# Patient Record
Sex: Female | Born: 1960 | Race: White | Hispanic: No | Marital: Married | Smoking: Current every day smoker
Health system: Southern US, Community
[De-identification: ages and names within clinical notes are randomized; demographics above are authoritative.]

## PROBLEM LIST (undated history)

## (undated) DIAGNOSIS — M5430 Sciatica, unspecified side: Secondary | ICD-10-CM

## (undated) DIAGNOSIS — M1990 Unspecified osteoarthritis, unspecified site: Secondary | ICD-10-CM

## (undated) DIAGNOSIS — M259 Joint disorder, unspecified: Secondary | ICD-10-CM

## (undated) HISTORY — DX: Sciatica, unspecified side: M54.30

## (undated) HISTORY — DX: Unspecified osteoarthritis, unspecified site: M19.90

## (undated) HISTORY — DX: Joint disorder, unspecified: M25.9

---

## 1980-01-17 HISTORY — PX: KNEE ARTHROPLASTY: SHX992

## 1999-01-17 HISTORY — PX: BREAST BIOPSY: SHX20

## 1999-01-17 HISTORY — PX: BREAST EXCISIONAL BIOPSY: SUR124

## 2010-01-16 LAB — HM MAMMOGRAPHY: HM MAMMO: NORMAL

## 2011-08-17 LAB — HM PAP SMEAR: HM Pap smear: NORMAL

## 2011-08-17 LAB — HM DEXA SCAN

## 2014-03-12 ENCOUNTER — Ambulatory Visit: Payer: Self-pay | Admitting: Family Medicine

## 2014-07-16 ENCOUNTER — Encounter: Payer: Self-pay | Admitting: Behavioral Health

## 2014-07-16 ENCOUNTER — Telehealth: Payer: Self-pay | Admitting: Behavioral Health

## 2014-07-16 NOTE — Telephone Encounter (Signed)
Pre-Visit Call completed with patient and chart updated.   Pre-Visit Info documented in Specialty Comments under Snap[employer]ot.    

## 2014-07-17 ENCOUNTER — Encounter: Payer: Self-pay | Admitting: Family Medicine

## 2014-07-17 ENCOUNTER — Ambulatory Visit (INDEPENDENT_AMBULATORY_CARE_PROVIDER_SITE_OTHER): Payer: BLUE CROSS/BLUE SHIELD | Admitting: Family Medicine

## 2014-07-17 VITALS — BP 130/80 | HR 81 | Temp 98.1°F | Resp 16 | Ht 62.75 in | Wt 253.1 lb

## 2014-07-17 DIAGNOSIS — Z1231 Encounter for screening mammogram for malignant neoplasm of breast: Secondary | ICD-10-CM

## 2014-07-17 DIAGNOSIS — Z0000 Encounter for general adult medical examination without abnormal findings: Secondary | ICD-10-CM | POA: Diagnosis not present

## 2014-07-17 DIAGNOSIS — Z1211 Encounter for screening for malignant neoplasm of colon: Secondary | ICD-10-CM

## 2014-07-17 LAB — HEPATIC FUNCTION PANEL
ALBUMIN: 3.7 g/dL (ref 3.5–5.2)
ALK PHOS: 91 U/L (ref 39–117)
ALT: 12 U/L (ref 0–35)
AST: 14 U/L (ref 0–37)
BILIRUBIN DIRECT: 0 mg/dL (ref 0.0–0.3)
BILIRUBIN TOTAL: 0.2 mg/dL (ref 0.2–1.2)
Total Protein: 7.4 g/dL (ref 6.0–8.3)

## 2014-07-17 LAB — CBC WITH DIFFERENTIAL/PLATELET
BASOS ABS: 0 10*3/uL (ref 0.0–0.1)
BASOS PCT: 0.4 % (ref 0.0–3.0)
EOS PCT: 1.6 % (ref 0.0–5.0)
Eosinophils Absolute: 0.1 10*3/uL (ref 0.0–0.7)
HEMATOCRIT: 40.5 % (ref 36.0–46.0)
HEMOGLOBIN: 13.1 g/dL (ref 12.0–15.0)
LYMPHS ABS: 2.3 10*3/uL (ref 0.7–4.0)
Lymphocytes Relative: 31 % (ref 12.0–46.0)
MCHC: 32.5 g/dL (ref 30.0–36.0)
MCV: 86.1 fl (ref 78.0–100.0)
Monocytes Absolute: 0.5 10*3/uL (ref 0.1–1.0)
Monocytes Relative: 6.7 % (ref 3.0–12.0)
NEUTROS PCT: 60.3 % (ref 43.0–77.0)
Neutro Abs: 4.5 10*3/uL (ref 1.4–7.7)
Platelets: 390 10*3/uL (ref 150.0–400.0)
RBC: 4.7 Mil/uL (ref 3.87–5.11)
RDW: 14.4 % (ref 11.5–15.5)
WBC: 7.5 10*3/uL (ref 4.0–10.5)

## 2014-07-17 LAB — BASIC METABOLIC PANEL
BUN: 13 mg/dL (ref 6–23)
CHLORIDE: 105 meq/L (ref 96–112)
CO2: 26 mEq/L (ref 19–32)
CREATININE: 0.78 mg/dL (ref 0.40–1.20)
Calcium: 8.9 mg/dL (ref 8.4–10.5)
GFR: 81.66 mL/min (ref >60.00)
GLUCOSE: 104 mg/dL — AB (ref 70–99)
Potassium: 3.8 mEq/L (ref 3.5–5.1)
Sodium: 139 mEq/L (ref 135–145)

## 2014-07-17 LAB — LIPID PANEL
CHOL/HDL RATIO: 5
Cholesterol: 147 mg/dL (ref 0–200)
HDL: 32.2 mg/dL — ABNORMAL LOW (ref >39.00)
LDL Cholesterol: 79 mg/dL (ref 0–99)
NonHDL: 114.8
Triglycerides: 178 mg/dL — ABNORMAL HIGH (ref 0.0–149.0)
VLDL: 35.6 mg/dL (ref 0.0–40.0)

## 2014-07-17 LAB — TSH: TSH: 1.58 u[IU]/mL (ref 0.35–4.50)

## 2014-07-17 LAB — VITAMIN D 25 HYDROXY (VIT D DEFICIENCY, FRACTURES): VITD: 10.2 ng/mL — ABNORMAL LOW (ref 30.00–100.00)

## 2014-07-17 NOTE — Progress Notes (Signed)
   Subjective:    Patient ID: Stephanie SarkNancy Callejo, female    DOB: 02/22/1960, 54 y.o.   MRN: [id number]  HPI New to establish.  Previous MD- none recently  GYN- Goins, last seen 2013 [facility]([place]).  Health maintenance- mammo done Aug 2015 at [facility] in [place].  Pap done 2013.  Has not had colonoscopy.   Review of Systems Patient reports no vision/ hearing changes, adenopathy,fever, weight change,  persistant/recurrent hoarseness , swallowing issues, chest pain, palpitations, edema, persistant/recurrent cough, hemoptysis, dyspnea (rest/exertional/paroxysmal nocturnal), gastrointestinal bleeding (melena, rectal bleeding), abdominal pain, significant heartburn, bowel changes, GU symptoms (dysuria, hematuria, incontinence), Gyn symptoms (abnormal  bleeding, pain),  syncope, focal weakness, memory loss, numbness & tingling, skin/hair/nail changes, abnormal bruising or bleeding, anxiety, or depression.     Objective:   Physical Exam General Appearance:    Alert, cooperative, no distress, appears stated age, obese  Head:    Normocephalic, without obvious abnormality, atraumatic  Eyes:    PERRL, conjunctiva/corneas clear, EOM's intact, fundi    benign, both eyes  Ears:    Normal TM's and external ear canals, both ears  Nose:   Nares normal, septum midline, mucosa normal, no drainage    or sinus tenderness  Throat:   Lips, mucosa, and tongue normal; teeth and gums normal  Neck:   Supple, symmetrical, trachea midline, no adenopathy;    Thyroid: no enlargement/tenderness/nodules  Back:     Symmetric, no curvature, ROM normal, no CVA tenderness  Lungs:     Clear to auscultation bilaterally, respirations unlabored  Chest Wall:    No tenderness or deformity   Heart:    Regular rate and rhythm, S1 and S2 normal, no murmur, rub   or gallop  Breast Exam:    Deferred to GYN  Abdomen:     Soft, non-tender, bowel sounds active all four quadrants,    no masses, no organomegaly  Genitalia:    Deferred to GYN    Rectal:    Extremities:   Extremities normal, atraumatic, no cyanosis or edema  Pulses:   2+ and symmetric all extremities  Skin:   Skin color, texture, turgor normal, no rashes or lesions  Lymph nodes:   Cervical, supraclavicular, and axillary nodes normal  Neurologic:   CNII-XII intact, normal strength, sensation and reflexes    throughout          Assessment & Plan:

## 2014-07-17 NOTE — Progress Notes (Signed)
Pre visit review using our clinic review tool, if applicable. No additional management support is needed unless otherwise documented below in the visit note. 

## 2014-07-17 NOTE — Patient Instructions (Signed)
Schedule your pap at your convenience (15 minute appt) We'll notify you of your lab results and make any changes if needed We'll call you with your mammogram appt and GI consultation to discuss colonoscopy Try and make healthy food choices and get regular exercise as you are able Call with any questions or concerns Welcome!  We're glad to have you!

## 2014-07-17 NOTE — Assessment & Plan Note (Signed)
Pt's PE WNL w/ exception of obesity.  Due for mammo- order entered.  Due for pap- pt to schedule at her convenience.  Check labs.  Anticipatory guidance provided.

## 2014-07-21 ENCOUNTER — Other Ambulatory Visit: Payer: Self-pay | Admitting: General Practice

## 2014-07-21 MED ORDER — VITAMIN D (ERGOCALCIFEROL) 1.25 MG (50000 UNIT) PO CAPS: 50000.0000 [IU] | ORAL_CAPSULE | ORAL | Status: DC

## 2014-09-01 ENCOUNTER — Ambulatory Visit (HOSPITAL_BASED_OUTPATIENT_CLINIC_OR_DEPARTMENT_OTHER)
Admission: RE | Admit: 2014-09-01 | Discharge: 2014-09-01 | Disposition: A | Discharge status: Home / Self Care | Payer: BLUE CROSS/BLUE SHIELD | Source: Ambulatory Visit | Attending: Family Medicine | Admitting: Family Medicine

## 2014-09-01 DIAGNOSIS — Z1231 Encounter for screening mammogram for malignant neoplasm of breast: Secondary | ICD-10-CM | POA: Diagnosis not present

## 2014-09-09 ENCOUNTER — Other Ambulatory Visit (HOSPITAL_COMMUNITY)
Admission: RE | Admit: 2014-09-09 | Discharge: 2014-09-09 | Disposition: A | Discharge status: Home / Self Care | Payer: BLUE CROSS/BLUE SHIELD | Source: Ambulatory Visit | Attending: Family Medicine | Admitting: Family Medicine

## 2014-09-09 ENCOUNTER — Encounter: Payer: Self-pay | Admitting: Family Medicine

## 2014-09-09 ENCOUNTER — Ambulatory Visit (INDEPENDENT_AMBULATORY_CARE_PROVIDER_SITE_OTHER): Payer: BLUE CROSS/BLUE SHIELD | Admitting: Family Medicine

## 2014-09-09 VITALS — BP 128/82 | HR 91 | Temp 98.0°F | Resp 16 | Ht 63.0 in | Wt 255.0 lb

## 2014-09-09 DIAGNOSIS — Z01419 Encounter for gynecological examination (general) (routine) without abnormal findings: Secondary | ICD-10-CM | POA: Diagnosis not present

## 2014-09-09 DIAGNOSIS — Z1151 Encounter for screening for human papillomavirus (HPV): Secondary | ICD-10-CM | POA: Insufficient documentation

## 2014-09-09 DIAGNOSIS — Z124 Encounter for screening for malignant neoplasm of cervix: Secondary | ICD-10-CM

## 2014-09-09 NOTE — Progress Notes (Signed)
Pre visit review using our clinic review tool, if applicable. No additional management support is needed unless otherwise documented below in the visit note. 

## 2014-09-09 NOTE — Patient Instructions (Signed)
Follow up in 1 year or as needed We'll notify you of your pap results and make any changes if needed Continue to work on healthy diet and regular exercise Call with any questions or concerns Happy Labor Day!!!

## 2014-09-11 ENCOUNTER — Encounter: Payer: Self-pay | Admitting: General Practice

## 2014-09-11 LAB — CYTOLOGY - PAP

## 2014-09-14 NOTE — Assessment & Plan Note (Signed)
Pap collected.  Breast exam performed.  No abnormalities noted on either

## 2014-09-14 NOTE — Progress Notes (Signed)
   Subjective:    Patient ID: Stephanie Gallagher, female    DOB: April 25, 1960, 54 y.o.   MRN: [id number]  HPI Here today for pap and breast exam.  No concerns.   Review of Systems No vaginal d/c, bleeding, pain, skin changes, masses, excessive cramping No breast masses, pain, skin changes, nipple d/c    Objective:   Physical Exam  Constitutional: She appears well-developed and well-nourished. No distress.  HENT:  Head: Normocephalic and atraumatic.  Pulmonary/Chest: She exhibits no tenderness. Right breast exhibits no inverted nipple, no mass, no nipple discharge, no skin change and no tenderness. Left breast exhibits no inverted nipple, no mass, no nipple discharge, no skin change and no tenderness.  Genitourinary: There is no rash, tenderness, lesion or injury on the right labia. There is no rash, tenderness, lesion or injury on the left labia. No erythema, tenderness or bleeding in the vagina. No foreign body around the vagina. No signs of injury around the vagina. No vaginal discharge found.  Vitals reviewed.         Assessment & Plan:

## 2015-01-17 HISTORY — PX: ROOT CANAL: SHX2363

## 2015-03-17 ENCOUNTER — Telehealth: Payer: Self-pay | Admitting: Family Medicine

## 2015-03-17 NOTE — Telephone Encounter (Signed)
Chart updated to reflect 

## 2015-03-17 NOTE — Telephone Encounter (Signed)
Pt declined the flu shot.

## 2015-08-17 ENCOUNTER — Telehealth: Payer: Self-pay | Admitting: Family Medicine

## 2015-08-17 NOTE — Telephone Encounter (Signed)
Pt called in to schedule her CPE. Pt says that she would like to switch providers. Pt would like to switch to Dr. Abner Greenspan.     CB:[phone]

## 2015-08-17 NOTE — Telephone Encounter (Signed)
OK with me if [facility] with Dr Beverely Low

## 2015-08-18 NOTE — Telephone Encounter (Signed)
Ok with me 

## 2015-08-18 NOTE — Telephone Encounter (Signed)
Called pt to schedule appt. Pt says that she would now like to establish with Dr. Patsy Lager instead.

## 2015-08-18 NOTE — Telephone Encounter (Signed)
ok 

## 2015-09-22 ENCOUNTER — Encounter: Payer: Self-pay | Admitting: *Deleted

## 2015-09-22 ENCOUNTER — Telehealth: Payer: Self-pay | Admitting: *Deleted

## 2015-09-22 NOTE — Telephone Encounter (Signed)
Pre-Visit Call completed with patient and chart updated.   Pre-Visit Info documented in Specialty Comments under Snap[employer]ot.    

## 2015-09-23 ENCOUNTER — Ambulatory Visit (INDEPENDENT_AMBULATORY_CARE_PROVIDER_SITE_OTHER): Payer: BLUE CROSS/BLUE SHIELD | Admitting: Family Medicine

## 2015-09-23 ENCOUNTER — Encounter: Payer: Self-pay | Admitting: Family Medicine

## 2015-09-23 ENCOUNTER — Ambulatory Visit (HOSPITAL_BASED_OUTPATIENT_CLINIC_OR_DEPARTMENT_OTHER)
Admission: RE | Admit: 2015-09-23 | Discharge: 2015-09-23 | Disposition: A | Discharge status: Home / Self Care | Payer: BLUE CROSS/BLUE SHIELD | Source: Ambulatory Visit | Attending: Family Medicine | Admitting: Family Medicine

## 2015-09-23 VITALS — BP 138/82 | HR 97 | Temp 98.6°F | Ht 63.0 in | Wt 250.8 lb

## 2015-09-23 DIAGNOSIS — M25561 Pain in right knee: Secondary | ICD-10-CM

## 2015-09-23 DIAGNOSIS — S8011XA Contusion of right lower leg, initial encounter: Secondary | ICD-10-CM | POA: Diagnosis not present

## 2015-09-23 DIAGNOSIS — Z1211 Encounter for screening for malignant neoplasm of colon: Secondary | ICD-10-CM

## 2015-09-23 DIAGNOSIS — W19XXXA Unspecified fall, initial encounter: Secondary | ICD-10-CM | POA: Insufficient documentation

## 2015-09-23 NOTE — Progress Notes (Signed)
[facility] [address], [address] [place], [place] [address] [phone]  Date:  09/23/2015   Name:  Stephanie Gallagher   DOB:  1960-12-20   MRN:  [id number]  PCP:  Abbe Amsterdam, MD    Chief Complaint: Establish Care (Former pt of Dr. Beverely Low. c/o still having lump and swelling around right knee since fallin on Aug. 3rd. )   History of Present Illness:  Stephanie Gallagher is a 55 y.o. very pleasant female patient who presents with the following:  Here today to establish care and discuss a knee problem She is overall doing well- however she fell about 5 weeks ago. She fell on 8/3- she has bad ankles and started to roll her ankle- she tried to correct but fell onto her right knee and shin.  She fell onto pavement.  A small rock broke the skin. Her leg swelled quite a bit and she bruised terribly from knee to ankle per her report.   She was seen by her doctor in [place] and had an x-ray; nothing was broken.  Her knee/ shin is getting better but is still bothering her some. The knee still swells by the end of the day.  The knee does feel unstable, but no clicking or popping. 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   She has not yet had a colonoscopy but would like to do cologuard.  We did this paperwork today Declines a flu shot today She is s/p menopause She plans to see me for a CPE in the next month or so  Patient Active Problem List   Diagnosis Date Noted  . Pap smear for cervical cancer screening 09/09/2014  . Physical exam 07/17/2014    Past Medical History:  Diagnosis Date  . 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 Osteoarthritis   . Sciatica     Past Surgical History:  Procedure Laterality Date  . BREAST BIOPSY Right 2001  . CESAREAN SECTION  1993  . KNEE ARTHROPLASTY Right 1982  . ROOT CANAL  2017    Social History   Substance Use Topics  . Smoking status: Current Every Day Smoker    Packs/day: 0.25    Types: Cigarettes  . Smokeless tobacco: Not on file  . Alcohol use Yes    Family History  Problem Relation Age of Onset  . Cancer Mother     mother passed of Gall bladder cancer  . Stroke Father     history of mini strokes  . Heart attack Paternal Grandfather     Allergies  Allergen Reactions  . Codeine Other (See Comments)    Pt. reported that she has blackouts and her pain level increases tremendously.  Marland Kitchen Hydrocodone Anaphylaxis    Medication list has been reviewed and updated.  Current Outpatient Prescriptions on File Prior to Visit  Medication Sig Dispense Refill  . BIOTIN PO Take by mouth daily.    . Cholecalciferol (VITAMIN D3) 5000 units TABS Take 1 tablet by mouth daily.    Marland Kitchen ibuprofen (ADVIL,MOTRIN) 800 MG tablet Take 800 mg by mouth daily as needed.     No current facility-administered medications on file prior to visit.     Review of Systems:  As per HPI- otherwise negative. No SOB, never had a DVT or PE, she is a light  smoker No chest pain She is not on hormones    Physical Examination: Vitals:   09/23/15 1518 09/23/15 1523  BP: (!) 146/78 138/82  Pulse: 97   Temp: 98.6 F (37 C)    Vitals:   09/23/15 1518  Weight: 250 lb 12.8 oz (113.8 kg)  Height: 5\' 3"  (1.6 m)   Body mass index is 44.43 kg/m. Ideal Body Weight: Weight in (lb) to have BMI = 25: 140.8  GEN: WDWN, NAD, Non-toxic, A & O x 3, obese but otherwise looks well HEENT: Atraumatic, Normocephalic. Neck supple. No masses, No LAD. Ears and Nose: No external deformity. CV: RRR, No M/G/R. No JVD. No thrill. No extra heart sounds. PULM: CTA B, no wheezes, crackles, rhonchi. No retractions. No resp. distress. No accessory muscle use. EXTR: No c/c/e NEURO Normal gait.  PSYCH: Normally interactive. Conversant. Not depressed or anxious appearing.  Calm demeanor.  Right knee: small scar lateral to the  patella from her operation years ago Some crepitus of the knee, no effusion She is tender along the lateral mid to upper tibia.  No current bruise, redness, swelling or heat No calf pain or swelling. Normal ROM of the knee and the ligaments are stable to my exam    Assessment and Plan: Contusion of right lower leg, initial encounter - Plan: DG Tibia/Fibula Right  Screening for colon cancer  Right knee pain - Plan: DG Knee Complete 4 Views Right  Here today to establish care and re-check on a knee and shin injury. Seen for this originally at another office. She still has pain after 5 weeks so will repeat films to r/o occult fracture.  Assuming this is negative she likely needs time for bone bruising and blood in tissues to resolve.  However if knee pain persists consider referral to ortho vs MRI Await films and will follow-up with her cologuard order sent   Signed Abbe AmsterdamJessica Javayah Magaw, MD

## 2015-09-23 NOTE — Patient Instructions (Signed)
It was very nice to meet you today Please schedule your physical at your convenience Go to the ground floor for x-rays; then you can head home; I'll be in touch with your results  The [employer] company should contact you about your cologuard exam

## 2015-09-23 NOTE — Progress Notes (Signed)
Pre visit review using our clinic review tool, if applicable. No additional management support is needed unless otherwise documented below in the visit note. 

## 2015-09-24 ENCOUNTER — Encounter: Payer: Self-pay | Admitting: Family Medicine

## 2015-10-14 ENCOUNTER — Encounter: Payer: Self-pay | Admitting: Family Medicine

## 2015-10-14 ENCOUNTER — Ambulatory Visit (INDEPENDENT_AMBULATORY_CARE_PROVIDER_SITE_OTHER): Payer: BLUE CROSS/BLUE SHIELD | Admitting: Family Medicine

## 2015-10-14 VITALS — BP 125/70 | HR 85 | Temp 98.4°F | Ht 63.0 in | Wt 250.0 lb

## 2015-10-14 DIAGNOSIS — Z1329 Encounter for screening for other suspected endocrine disorder: Secondary | ICD-10-CM

## 2015-10-14 DIAGNOSIS — Z13220 Encounter for screening for lipoid disorders: Secondary | ICD-10-CM

## 2015-10-14 DIAGNOSIS — Z130 Encounter for screening for diseases of the blood and blood-forming organs and certain disorders involving the immune mechanism: Secondary | ICD-10-CM | POA: Diagnosis not present

## 2015-10-14 DIAGNOSIS — Z131 Encounter for screening for diabetes mellitus: Secondary | ICD-10-CM | POA: Diagnosis not present

## 2015-10-14 DIAGNOSIS — E559 Vitamin D deficiency, unspecified: Secondary | ICD-10-CM

## 2015-10-14 DIAGNOSIS — Z0000 Encounter for general adult medical examination without abnormal findings: Secondary | ICD-10-CM | POA: Diagnosis not present

## 2015-10-14 NOTE — Progress Notes (Signed)
Pre visit review using our clinic review tool, if applicable. No additional management support is needed unless otherwise documented below in the visit note. 

## 2015-10-14 NOTE — Patient Instructions (Signed)
It was good to see you today- your blood pressure was fine on recheck I will be in touch with your labs Please call you last PCP office and see if you can get a date of your last tetanus shot Try to get a colonoscopy soon and a mammo in the next year

## 2015-10-14 NOTE — Progress Notes (Addendum)
[facility] [address] [place], [place] [address] [phone]  Date:  10/14/2015   Name:  Stephanie Gallagher   DOB:  1960/10/25   MRN:  [id number]  PCP:  Abbe Amsterdam, MD    Chief Complaint: Follow-up (Pt here for f/u. )   History of Present Illness:  Stephanie Gallagher is a 55 y.o. very pleasant female patient who presents with the following:  Here today for a CPE Pap last year- normal and HPV negative -she has never had an abnormal pap She gets mammo most years- never had anything concerning Her younger sister was recently dx with ovarian cancer- she is doing well and had surgery.   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 Obesity, morbid (HCC) 09/23/2015  . Pap smear for cervical cancer screening 09/09/2014  . Physical exam 07/17/2014    Past Medical History:  Diagnosis Date  . 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 Osteoarthritis   . Sciatica     Past Surgical History:  Procedure Laterality Date  . BREAST BIOPSY Right 2001  . CESAREAN SECTION  1993  . KNEE ARTHROPLASTY Right 1982  . ROOT CANAL  2017    Social History  Substance Use Topics  . Smoking status: Current Every Day Smoker    Packs/day: 0.25    Types: Cigarettes  . Smokeless tobacco: Not on file  . Alcohol use Yes    Family History  Problem Relation Age of Onset  . Cancer Mother     mother passed of Gall bladder cancer  . Stroke Father     history of mini strokes  . Heart attack Paternal Grandfather    . Ovarian cancer Other     half-sister. Stage 2    Allergies  Allergen Reactions  . Codeine Other (See Comments)    Pt. reported that she has blackouts and her pain level increases tremendously.  Marland Kitchen Hydrocodone Anaphylaxis    Medication list has been reviewed and updated.  Current Outpatient Prescriptions on File Prior to Visit  Medication Sig Dispense Refill  . BIOTIN PO Take by mouth daily.    . Cholecalciferol (VITAMIN D3) 5000 units TABS Take 1 tablet by mouth daily.    Marland Kitchen ibuprofen (ADVIL,MOTRIN) 800 MG tablet Take 800 mg by mouth daily as needed.     No current facility-administered medications on file prior to visit.     Review of Systems:  As per HPI- otherwise negative. No fever, chills, CP, SOB, nausea or vomiting   Physical Examination: Vitals:   10/14/15 1803 10/14/15 1806  BP: (!) 146/88 (!) 144/88  Pulse: 85   Temp: 98.4 F (36.9 C)    Vitals:   10/14/15 1803  Weight: 250 lb (113.4 kg)  Height: 5\' 3"  (1.6 m)   Body mass index is 44.29 kg/m. Ideal Body Weight: Weight in (lb) to have BMI = 25: 140.8  GEN: WDWN, NAD, Non-toxic, A & O x 3, obese, looks well HEENT: Atraumatic, Normocephalic. Neck supple. No masses, No LAD. Bilateral TM wnl, oropharynx normal.  PEERL,EOMI.   Ears and Nose: No external deformity. CV: RRR, No M/G/R. No JVD. No thrill. No extra heart sounds. PULM: CTA B, no wheezes, crackles, rhonchi. No retractions. No resp. distress. No accessory muscle use. ABD: S, NT, ND, +BS. No rebound. No HSM. EXTR: No c/c/e NEURO Normal gait.  PSYCH: Normally interactive. Conversant. Not depressed or anxious appearing.  Calm demeanor.    Assessment and Plan: Physical exam  Screening for hyperlipidemia - Plan: Lipid panel  Screening for deficiency anemia - Plan: CBC  Screening for diabetes mellitus - Plan: Comprehensive metabolic panel  Vitamin D deficiency - Plan: Vitamin D (25 hydroxy)  Screening for thyroid disorder - Plan: TSH  CPE  and labs today  It was good to see you today- your blood pressure was fine on recheck I will be in touch with your labs Please call you last PCP office and see if you can get a date of your last tetanus shot Try to get a colonoscopy soon and a mammo in the next year  Signed Abbe AmsterdamJessica Chaney Maclaren, MD

## 2015-10-15 LAB — LIPID PANEL
Cholesterol: 157 mg/dL (ref 0–200)
HDL: 33 mg/dL — AB (ref >39.00)
NonHDL: 124.17
TRIGLYCERIDES: 256 mg/dL — AB (ref 0.0–149.0)
Total CHOL/HDL Ratio: 5
VLDL: 51.2 mg/dL — AB (ref 0.0–40.0)

## 2015-10-15 LAB — COMPREHENSIVE METABOLIC PANEL
ALK PHOS: 103 U/L (ref 39–117)
ALT: 11 U/L (ref 0–35)
AST: 14 U/L (ref 0–37)
Albumin: 3.8 g/dL (ref 3.5–5.2)
BILIRUBIN TOTAL: 0.2 mg/dL (ref 0.2–1.2)
BUN: 12 mg/dL (ref 6–23)
CALCIUM: 8.8 mg/dL (ref 8.4–10.5)
CO2: 29 meq/L (ref 19–32)
Chloride: 103 mEq/L (ref 96–112)
Creatinine, Ser: 0.83 mg/dL (ref 0.40–1.20)
GFR: 75.66 mL/min (ref >60.00)
GLUCOSE: 89 mg/dL (ref 70–99)
POTASSIUM: 4.3 meq/L (ref 3.5–5.1)
Sodium: 139 mEq/L (ref 135–145)
TOTAL PROTEIN: 7.5 g/dL (ref 6.0–8.3)

## 2015-10-15 LAB — TSH: TSH: 1.91 u[IU]/mL (ref 0.35–4.50)

## 2015-10-15 LAB — CBC
HEMATOCRIT: 39.5 % (ref 36.0–46.0)
Hemoglobin: 12.8 g/dL (ref 12.0–15.0)
MCHC: 32.5 g/dL (ref 30.0–36.0)
MCV: 80.8 fl (ref 78.0–100.0)
Platelets: 329 10*3/uL (ref 150.0–400.0)
RBC: 4.89 Mil/uL (ref 3.87–5.11)
RDW: 15.5 % (ref 11.5–15.5)
WBC: 10.6 10*3/uL — AB (ref 4.0–10.5)

## 2015-10-15 LAB — VITAMIN D 25 HYDROXY (VIT D DEFICIENCY, FRACTURES): VITD: 43.84 ng/mL (ref 30.00–100.00)

## 2015-10-15 LAB — LDL CHOLESTEROL, DIRECT: Direct LDL: 87 mg/dL

## 2015-12-15 ENCOUNTER — Telehealth: Payer: Self-pay | Admitting: Family Medicine

## 2015-12-15 NOTE — Telephone Encounter (Signed)
Called pt- she had a mamm[facility] here at the [facility] last year- she then had a screening per her job with [employer] recently.  She was told that her results might be abnl, but they "could not tell me what was wrong."  She is not sure if there is a problem or if she just needs comparison. She is having the images send to me; I will then ask if radiology can compare the two.  Asked her to let me know if she does not hear from me about this in the next week and she agrees

## 2015-12-15 NOTE — Telephone Encounter (Addendum)
Relation to ZO:XWRUpt:self Call back number:[phone]   Reason for call:  Patient states " [facility]ena[facility]" came to patient work place 11/25/15 and when patient tried to obtain the report [facility] informed her additonal testing was required and stated no additional information can be given unless patient schedules appointment with [facility] patient states locations offered are [address]. Patient requested report please send to PCP [facility] informed report will be mailed. Patient wanted to inform PCP  [facility] [phone]

## 2015-12-23 ENCOUNTER — Encounter: Payer: Self-pay | Admitting: Family Medicine

## 2015-12-25 ENCOUNTER — Encounter: Payer: Self-pay | Admitting: Family Medicine

## 2016-01-12 ENCOUNTER — Encounter: Payer: Self-pay | Admitting: Family Medicine

## 2016-01-24 ENCOUNTER — Encounter: Payer: Self-pay | Admitting: Family Medicine

## 2016-09-27 ENCOUNTER — Telehealth: Payer: Self-pay | Admitting: *Deleted

## 2016-09-27 NOTE — Telephone Encounter (Signed)
Received Cologuard Order Cancellation: [id number]; order has been changed to Suspended for Inactivity, the order will be reactivated if patient returns their sample w/i 365 days of the Initial order. [employer] has made several attempts to reach patient by phone and letter unsuccessfully; forwarded to provider/SLS 09/12

## 2016-11-11 NOTE — Progress Notes (Addendum)
[facility] [place] [address], [address] [place], [place] [address] [phone] [phone]  Date:  11/13/2016   Name:  Stephanie Sarkancy Pfarr   DOB:  07/25/1960   MRN:  [id number]  PCP:  Pearline Cablesopland, Jessica C, MD    Chief Complaint: Annual Exam   History of Present Illness:  Stephanie Gallagher is a 56 y.o. very pleasant female patient who presents with the following:  Here today for a CPE Last seen by myself 9/17  Pap last year- normal and HPV negative -she has never had an abnormal pap She gets mammo most years- never had anything concerning Her younger sister was recently dx with ovarian cancer- she is doing well and had surgery.   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   Hopes to get a new job there  She is not sure about her last tetnus- will do for her today  She is not fasting- she ate a chick fil a sandwich for lunch  Patient Active Problem List   Diagnosis Date Noted  . Obesity, morbid (HCC) 09/23/2015  . Pap smear for cervical cancer screening 09/09/2014  . Physical exam 07/17/2014    Past Medical History:  Diagnosis Date  . 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 Osteoarthritis   . Sciatica     Past Surgical History:  Procedure Laterality Date  . BREAST BIOPSY Right 2001  . CESAREAN SECTION  1993  . KNEE ARTHROPLASTY Right 1982  . ROOT CANAL  2017    Social History  Substance Use Topics  . Smoking status: Current Every Day Smoker    Packs/day: 0.25    Types: Cigarettes  . Smokeless tobacco: Never Used  . Alcohol use Yes    Family History  Problem Relation Age of Onset  . Cancer Mother        mother passed of Gall bladder cancer  . Stroke Father        history of mini strokes  . Heart attack Paternal Grandfather   . Ovarian cancer Other        half-sister. Stage 2    Allergies  Allergen Reactions  . Codeine Other (See Comments)    Pt. reported that she has blackouts and her pain level increases tremendously.  Marland Kitchen. Hydrocodone Anaphylaxis    Medication list has been reviewed and updated.  Current Outpatient Prescriptions on File Prior to Visit  Medication Sig Dispense Refill  . ibuprofen (ADVIL,MOTRIN) 800 MG tablet  Take 800 mg by mouth daily as needed.     No current facility-administered medications on file prior to visit.     Review of Systems:  As per HPI- otherwise negative. No fever or chills No CP or SOB No concern about breast masses  No nausea, vomiting or diarrhea Menopausal   Physical Examination: Vitals:   11/13/16 1506  BP: 140/78  Temp: 98.1 F (36.7 C)   Vitals:   11/13/16 1506  Weight: 252 lb (114.3 kg)  Height: 5\' 2"  (1.575 m)   Body mass index is 46.09 kg/m. Ideal Body Weight: Weight in (lb) to have BMI = 25: 136.4  GEN: WDWN, NAD, Non-toxic, A & O x 3, obese, otherwise looks well HEENT: Atraumatic, Normocephalic. Neck supple. No masses, No LAD.  Bilateral TM wnl, oropharynx normal.  PEERL,EOMI.   Ears and Nose: No external deformity. CV: RRR, No M/G/R. No JVD. No thrill. No extra heart sounds. PULM: CTA B, no wheezes, crackles,  rhonchi. No retractions. No resp. distress. No accessory muscle use. ABD: S, NT, ND. No rebound. No HSM. EXTR: No c/c/e NEURO Normal gait.  PSYCH: Normally interactive. Conversant. Not depressed or anxious appearing.  Calm demeanor.    Assessment and Plan: Physical exam  Screening for hyperlipidemia - Plan: Lipid panel  Screening for deficiency anemia - Plan: CBC  Screening for diabetes mellitus - Plan: Comprehensive metabolic panel, Hemoglobin A1c  Vitamin D deficiency - Plan: Vitamin D, 25-hydroxy  Immunization due - Plan: Tdap vaccine greater than or equal to 7yo IM  Screening for breast cancer - Plan: MM SCREENING BREAST TOMO BILATERAL  Estrogen deficiency - Plan: DG Bone Density  Encounter for hepatitis C screening test for low risk patient - Plan: Hepatitis C antibody  Screening for colon cancer  Here today for a CPE Referral for mammo and dexa Ordered cologuard Labs pending as above   Signed Abbe Amsterdam, MD Updated mammo order to a diagnostic per radiology Received her labs-  Results for orders placed or performed in visit on 11/13/16  CBC  Result Value Ref Range   WBC 9.0 4.0 - 10.5 K/uL   RBC 4.73 3.87 - 5.11 Mil/uL   Platelets 399.0 150.0 - 400.0 K/uL   Hemoglobin 12.7 12.0 - 15.0 g/dL   HCT 06.2 37.6 - 28.3 %   MCV 83.9 78.0 - 100.0 fl   MCHC 32.0 30.0 - 36.0 g/dL   RDW 15.1 (H) 76.1 - 60.7 %  Comprehensive metabolic panel  Result Value Ref Range   Sodium 140 135 - 145 mEq/L   Potassium 4.2 3.5 - 5.1 mEq/L   Chloride 104 96 - 112 mEq/L   CO2 28 19 - 32 mEq/L   Glucose, Bld 110 (H) 70 - 99 mg/dL   BUN 13 6 - 23 mg/dL   Creatinine, Ser 3.71 0.40 - 1.20 mg/dL   Total Bilirubin 0.2 0.2 - 1.2 mg/dL   Alkaline Phosphatase 96 39 - 117 U/L   AST 12 0 - 37 U/L   ALT 10 0 - 35 U/L   Total Protein 7.5 6.0 - 8.3 g/dL   Albumin 4.1 3.5 - 5.2 g/dL   Calcium 9.3 8.4 - 06.2 mg/dL   GFR 69.48 >54.62 mL/min  Hemoglobin A1c  Result Value Ref Range   Hgb  A1c MFr Bld 6.1 4.6 - 6.5 %  Lipid panel  Result Value Ref Range   Cholesterol 175 0 - 200 mg/dL   Triglycerides 703.5 (H) 0.0 - 149.0  mg/dL   HDL 16.10 >96.04 mg/dL   VLDL 54.0 0.0 - 98.1 mg/dL   LDL Cholesterol 191 (H) 0 - 99 mg/dL   Total CHOL/HDL Ratio 4    NonHDL 136.14   Vitamin D, 25-hydroxy  Result Value Ref Range   VITD 48.49 30.00 - 100.00 ng/mL  Hepatitis C antibody  Result Value Ref Range   Hepatitis C Ab NON-REACTIVE NON-REACTI   SIGNAL TO CUT-OFF 0.03 <1.00

## 2016-11-13 ENCOUNTER — Encounter: Payer: Self-pay | Admitting: Family Medicine

## 2016-11-13 ENCOUNTER — Ambulatory Visit (INDEPENDENT_AMBULATORY_CARE_PROVIDER_SITE_OTHER): Payer: Commercial Managed Care - PPO | Admitting: Family Medicine

## 2016-11-13 VITALS — BP 120/78 | HR 86 | Temp 98.1°F | Ht 62.0 in | Wt 252.0 lb

## 2016-11-13 DIAGNOSIS — Z1231 Encounter for screening mammogram for malignant neoplasm of breast: Secondary | ICD-10-CM

## 2016-11-13 DIAGNOSIS — Z1159 Encounter for screening for other viral diseases: Secondary | ICD-10-CM | POA: Diagnosis not present

## 2016-11-13 DIAGNOSIS — E2839 Other primary ovarian failure: Secondary | ICD-10-CM

## 2016-11-13 DIAGNOSIS — Z1239 Encounter for other screening for malignant neoplasm of breast: Secondary | ICD-10-CM

## 2016-11-13 DIAGNOSIS — Z131 Encounter for screening for diabetes mellitus: Secondary | ICD-10-CM | POA: Diagnosis not present

## 2016-11-13 DIAGNOSIS — Z130 Encounter for screening for diseases of the blood and blood-forming organs and certain disorders involving the immune mechanism: Secondary | ICD-10-CM

## 2016-11-13 DIAGNOSIS — Z13220 Encounter for screening for lipoid disorders: Secondary | ICD-10-CM

## 2016-11-13 DIAGNOSIS — R7303 Prediabetes: Secondary | ICD-10-CM

## 2016-11-13 DIAGNOSIS — Z23 Encounter for immunization: Secondary | ICD-10-CM

## 2016-11-13 DIAGNOSIS — E559 Vitamin D deficiency, unspecified: Secondary | ICD-10-CM

## 2016-11-13 DIAGNOSIS — Z0000 Encounter for general adult medical examination without abnormal findings: Secondary | ICD-10-CM

## 2016-11-13 DIAGNOSIS — Z1211 Encounter for screening for malignant neoplasm of colon: Secondary | ICD-10-CM | POA: Diagnosis not present

## 2016-11-13 NOTE — Patient Instructions (Addendum)
It was good to see you again today!  Take care and I will be in touch with your labs We will set you up for your bone density and mammogram tests, and will order cologuard for you  Health Maintenance for Postmenopausal Women Menopause is a normal process in which your reproductive ability comes to an end. This process happens gradually over a span of months to years, usually between the ages of 33 and 29. Menopause is complete when you have missed 12 consecutive menstrual periods. It is important to talk with your health care provider about some of the most common conditions that affect postmenopausal women, such as heart disease, cancer, and bone loss (osteoporosis). Adopting a healthy lifestyle and getting preventive care can help to promote your health and wellness. Those actions can also lower your chances of developing some of these common conditions. What should I know about menopause? During menopause, you may experience a number of symptoms, such as:  Moderate-to-severe hot flashes.  Night sweats.  Decrease in sex drive.  Mood swings.  Headaches.  Tiredness.  Irritability.  Memory problems.  Insomnia.  Choosing to treat or not to treat menopausal changes is an individual decision that you make with your health care provider. What should I know about hormone replacement therapy and supplements? Hormone therapy products are effective for treating symptoms that are associated with menopause, such as hot flashes and night sweats. Hormone replacement carries certain risks, especially as you become older. If you are thinking about using estrogen or estrogen with progestin treatments, discuss the benefits and risks with your health care provider. What should I know about heart disease and stroke? Heart disease, heart attack, and stroke become more likely as you age. This may be due, in part, to the hormonal changes that your body experiences during menopause. These can affect how your  body processes dietary fats, triglycerides, and cholesterol. Heart attack and stroke are both medical emergencies. There are many things that you can do to help prevent heart disease and stroke:  Have your blood pressure checked at least every 1-2 years. High blood pressure causes heart disease and increases the risk of stroke.  If you are 25-40 years old, ask your health care provider if you should take aspirin to prevent a heart attack or a stroke.  Do not use any tobacco products, including cigarettes, chewing tobacco, or electronic cigarettes. If you need help quitting, ask your health care provider.  It is important to eat a healthy diet and maintain a healthy weight. ? Be sure to include plenty of vegetables, fruits, low-fat dairy products, and lean protein. ? Avoid eating foods that are high in solid fats, added sugars, or salt (sodium).  Get regular exercise. This is one of the most important things that you can do for your health. ? Try to exercise for at least 150 minutes each week. The type of exercise that you do should increase your heart rate and make you sweat. This is known as moderate-intensity exercise. ? Try to do strengthening exercises at least twice each week. Do these in addition to the moderate-intensity exercise.  Know your numbers.Ask your health care provider to check your cholesterol and your blood glucose. Continue to have your blood tested as directed by your health care provider.  What should I know about cancer screening? There are several types of cancer. Take the following steps to reduce your risk and to catch any cancer development as early as possible. Breast Cancer  Practice  breast self-awareness. ? This means understanding how your breasts normally appear and feel. ? It also means doing regular breast self-exams. Let your health care provider know about any changes, no matter how small.  If you are 34 or older, have a clinician do a breast exam  (clinical breast exam or CBE) every year. Depending on your age, family history, and medical history, it may be recommended that you also have a yearly breast X-ray (mammogram).  If you have a family history of breast cancer, talk with your health care provider about genetic screening.  If you are at high risk for breast cancer, talk with your health care provider about having an MRI and a mammogram every year.  Breast cancer (BRCA) gene test is recommended for women who have family members with BRCA-related cancers. Results of the assessment will determine the need for genetic counseling and BRCA1 and for BRCA2 testing. BRCA-related cancers include these types: ? Breast. This occurs in males or females. ? Ovarian. ? Tubal. This may also be called fallopian tube cancer. ? Cancer of the abdominal or pelvic lining (peritoneal cancer). ? Prostate. ? Pancreatic.  Cervical, Uterine, and Ovarian Cancer Your health care provider may recommend that you be screened regularly for cancer of the pelvic organs. These include your ovaries, uterus, and vagina. This screening involves a pelvic exam, which includes checking for microscopic changes to the surface of your cervix (Pap test).  For women ages 21-65, health care providers may recommend a pelvic exam and a Pap test every three years. For women ages 59-65, they may recommend the Pap test and pelvic exam, combined with testing for human papilloma virus (HPV), every five years. Some types of HPV increase your risk of cervical cancer. Testing for HPV may also be done on women of any age who have unclear Pap test results.  Other health care providers may not recommend any screening for nonpregnant women who are considered low risk for pelvic cancer and have no symptoms. Ask your health care provider if a screening pelvic exam is right for you.  If you have had past treatment for cervical cancer or a condition that could lead to cancer, you need Pap tests  and screening for cancer for at least 20 years after your treatment. If Pap tests have been discontinued for you, your risk factors (such as having a new sexual partner) need to be reassessed to determine if you should start having screenings again. Some women have medical problems that increase the chance of getting cervical cancer. In these cases, your health care provider may recommend that you have screening and Pap tests more often.  If you have a family history of uterine cancer or ovarian cancer, talk with your health care provider about genetic screening.  If you have vaginal bleeding after reaching menopause, tell your health care provider.  There are currently no reliable tests available to screen for ovarian cancer.  Lung Cancer Lung cancer screening is recommended for adults 97-48 years old who are at high risk for lung cancer because of a history of smoking. A yearly low-dose CT scan of the lungs is recommended if you:  Currently smoke.  Have a history of at least 30 pack-years of smoking and you currently smoke or have quit within the past 15 years. A pack-year is smoking an average of one pack of cigarettes per day for one year.  Yearly screening should:  Continue until it has been 15 years since you quit.  Stop  if you develop a health problem that would prevent you from having lung cancer treatment.  Colorectal Cancer  This type of cancer can be detected and can often be prevented.  Routine colorectal cancer screening usually begins at age 24 and continues through age 20.  If you have risk factors for colon cancer, your health care provider may recommend that you be screened at an earlier age.  If you have a family history of colorectal cancer, talk with your health care provider about genetic screening.  Your health care provider may also recommend using home test kits to check for hidden blood in your stool.  A small camera at the end of a tube can be used to  examine your colon directly (sigmoidoscopy or colonoscopy). This is done to check for the earliest forms of colorectal cancer.  Direct examination of the colon should be repeated every 5-10 years until age 55. However, if early forms of precancerous polyps or small growths are found or if you have a family history or genetic risk for colorectal cancer, you may need to be screened more often.  Skin Cancer  Check your skin from head to toe regularly.  Monitor any moles. Be sure to tell your health care provider: ? About any new moles or changes in moles, especially if there is a change in a mole's shape or color. ? If you have a mole that is larger than the size of a pencil eraser.  If any of your family members has a history of skin cancer, especially at a young age, talk with your health care provider about genetic screening.  Always use sunscreen. Apply sunscreen liberally and repeatedly throughout the day.  Whenever you are outside, protect yourself by wearing long sleeves, pants, a wide-brimmed hat, and sunglasses.  What should I know about osteoporosis? Osteoporosis is a condition in which bone destruction happens more quickly than new bone creation. After menopause, you may be at an increased risk for osteoporosis. To help prevent osteoporosis or the bone fractures that can happen because of osteoporosis, the following is recommended:  If you are 23-52 years old, get at least 1,000 mg of calcium and at least 600 mg of vitamin D per day.  If you are older than age 64 but younger than age 68, get at least 1,200 mg of calcium and at least 600 mg of vitamin D per day.  If you are older than age 61, get at least 1,200 mg of calcium and at least 800 mg of vitamin D per day.  Smoking and excessive alcohol intake increase the risk of osteoporosis. Eat foods that are rich in calcium and vitamin D, and do weight-bearing exercises several times each week as directed by your health care  provider. What should I know about how menopause affects my mental health? Depression may occur at any age, but it is more common as you become older. Common symptoms of depression include:  Low or sad mood.  Changes in sleep patterns.  Changes in appetite or eating patterns.  Feeling an overall lack of motivation or enjoyment of activities that you previously enjoyed.  Frequent crying spells.  Talk with your health care provider if you think that you are experiencing depression. What should I know about immunizations? It is important that you get and maintain your immunizations. These include:  Tetanus, diphtheria, and pertussis (Tdap) booster vaccine.  Influenza every year before the flu season begins.  Pneumonia vaccine.  Shingles vaccine.  Your health  care provider may also recommend other immunizations. This information is not intended to replace advice given to you by your health care provider. Make sure you discuss any questions you have with your health care provider. Document Released: 02/24/2005 Document Revised: 07/23/2015 Document Reviewed: 10/06/2014 Elsevier Interactive Patient Education  2018 [employer].

## 2016-11-14 ENCOUNTER — Encounter: Payer: Self-pay | Admitting: Family Medicine

## 2016-11-14 DIAGNOSIS — R7303 Prediabetes: Secondary | ICD-10-CM | POA: Insufficient documentation

## 2016-11-14 LAB — LIPID PANEL
CHOL/HDL RATIO: 4
Cholesterol: 175 mg/dL (ref 0–200)
HDL: 39.2 mg/dL (ref >39.00)
LDL CALC: 100 mg/dL — AB (ref 0–99)
NonHDL: 136.14
TRIGLYCERIDES: 179 mg/dL — AB (ref 0.0–149.0)
VLDL: 35.8 mg/dL (ref 0.0–40.0)

## 2016-11-14 LAB — CBC
HCT: 39.7 % (ref 36.0–46.0)
Hemoglobin: 12.7 g/dL (ref 12.0–15.0)
MCHC: 32 g/dL (ref 30.0–36.0)
MCV: 83.9 fl (ref 78.0–100.0)
PLATELETS: 399 10*3/uL (ref 150.0–400.0)
RBC: 4.73 Mil/uL (ref 3.87–5.11)
RDW: 16.1 % — AB (ref 11.5–15.5)
WBC: 9 10*3/uL (ref 4.0–10.5)

## 2016-11-14 LAB — HEPATITIS C ANTIBODY
HEP C AB: NONREACTIVE
SIGNAL TO CUT-OFF: 0.03 (ref <1.00)

## 2016-11-14 LAB — COMPREHENSIVE METABOLIC PANEL
ALT: 10 U/L (ref 0–35)
AST: 12 U/L (ref 0–37)
Albumin: 4.1 g/dL (ref 3.5–5.2)
Alkaline Phosphatase: 96 U/L (ref 39–117)
BILIRUBIN TOTAL: 0.2 mg/dL (ref 0.2–1.2)
BUN: 13 mg/dL (ref 6–23)
CHLORIDE: 104 meq/L (ref 96–112)
CO2: 28 meq/L (ref 19–32)
CREATININE: 0.82 mg/dL (ref 0.40–1.20)
Calcium: 9.3 mg/dL (ref 8.4–10.5)
GFR: 76.42 mL/min (ref >60.00)
GLUCOSE: 110 mg/dL — AB (ref 70–99)
Potassium: 4.2 mEq/L (ref 3.5–5.1)
Sodium: 140 mEq/L (ref 135–145)
Total Protein: 7.5 g/dL (ref 6.0–8.3)

## 2016-11-14 LAB — HEMOGLOBIN A1C: Hgb A1c MFr Bld: 6.1 % (ref 4.6–6.5)

## 2016-11-14 LAB — VITAMIN D 25 HYDROXY (VIT D DEFICIENCY, FRACTURES): VITD: 48.49 ng/mL (ref 30.00–100.00)

## 2016-11-14 NOTE — Addendum Note (Signed)
Addended by: Abbe AmsterdamOPLAND, JESSICA C on: 11/14/2016 09:07 PM   Modules accepted: Orders

## 2016-11-22 ENCOUNTER — Other Ambulatory Visit: Payer: Self-pay | Admitting: Family Medicine

## 2016-11-22 DIAGNOSIS — N6489 Other specified disorders of breast: Secondary | ICD-10-CM

## 2016-12-13 ENCOUNTER — Ambulatory Visit
Admission: RE | Admit: 2016-12-13 | Discharge: 2016-12-13 | Disposition: A | Discharge status: Home / Self Care | Payer: Commercial Managed Care - PPO | Source: Ambulatory Visit | Attending: Family Medicine | Admitting: Family Medicine

## 2016-12-13 ENCOUNTER — Encounter: Payer: Self-pay | Admitting: Family Medicine

## 2016-12-13 DIAGNOSIS — N6489 Other specified disorders of breast: Secondary | ICD-10-CM

## 2016-12-13 DIAGNOSIS — E2839 Other primary ovarian failure: Secondary | ICD-10-CM

## 2016-12-27 ENCOUNTER — Other Ambulatory Visit: Payer: Self-pay | Admitting: Family Medicine

## 2017-01-18 ENCOUNTER — Telehealth: Payer: Self-pay | Admitting: *Deleted

## 2017-01-18 NOTE — Telephone Encounter (Signed)
Received Cologuard Order Cancellation: [phone]; order has been changed to Suspended for Inactivity, the order will be reactivated if patient returns their sample w/i 365 days of the Initial order. [employer] has made several attempts to reach patient by phone and letter unsuccessfully; forwarded to provider/SLS 01/03

## 2017-11-19 ENCOUNTER — Telehealth: Payer: Self-pay | Admitting: *Deleted

## 2017-11-19 NOTE — Telephone Encounter (Signed)
Received Cologuard Order Cancellation: [id number]; order has been Cancelled because it has been Inactive, and has exceeded the 365 days from the Initial order; forwarded to provider/SLS 11/04

## 2018-08-25 IMAGING — DX DG KNEE COMPLETE 4+V*R*
4 series · 4 of 4 positions shown · non-contrast
Comparison: None.

CLINICAL DATA: Right knee pain.  Fall 1 month ago.

EXAM:
RIGHT KNEE - COMPLETE 4+ VIEW

[knee ap]
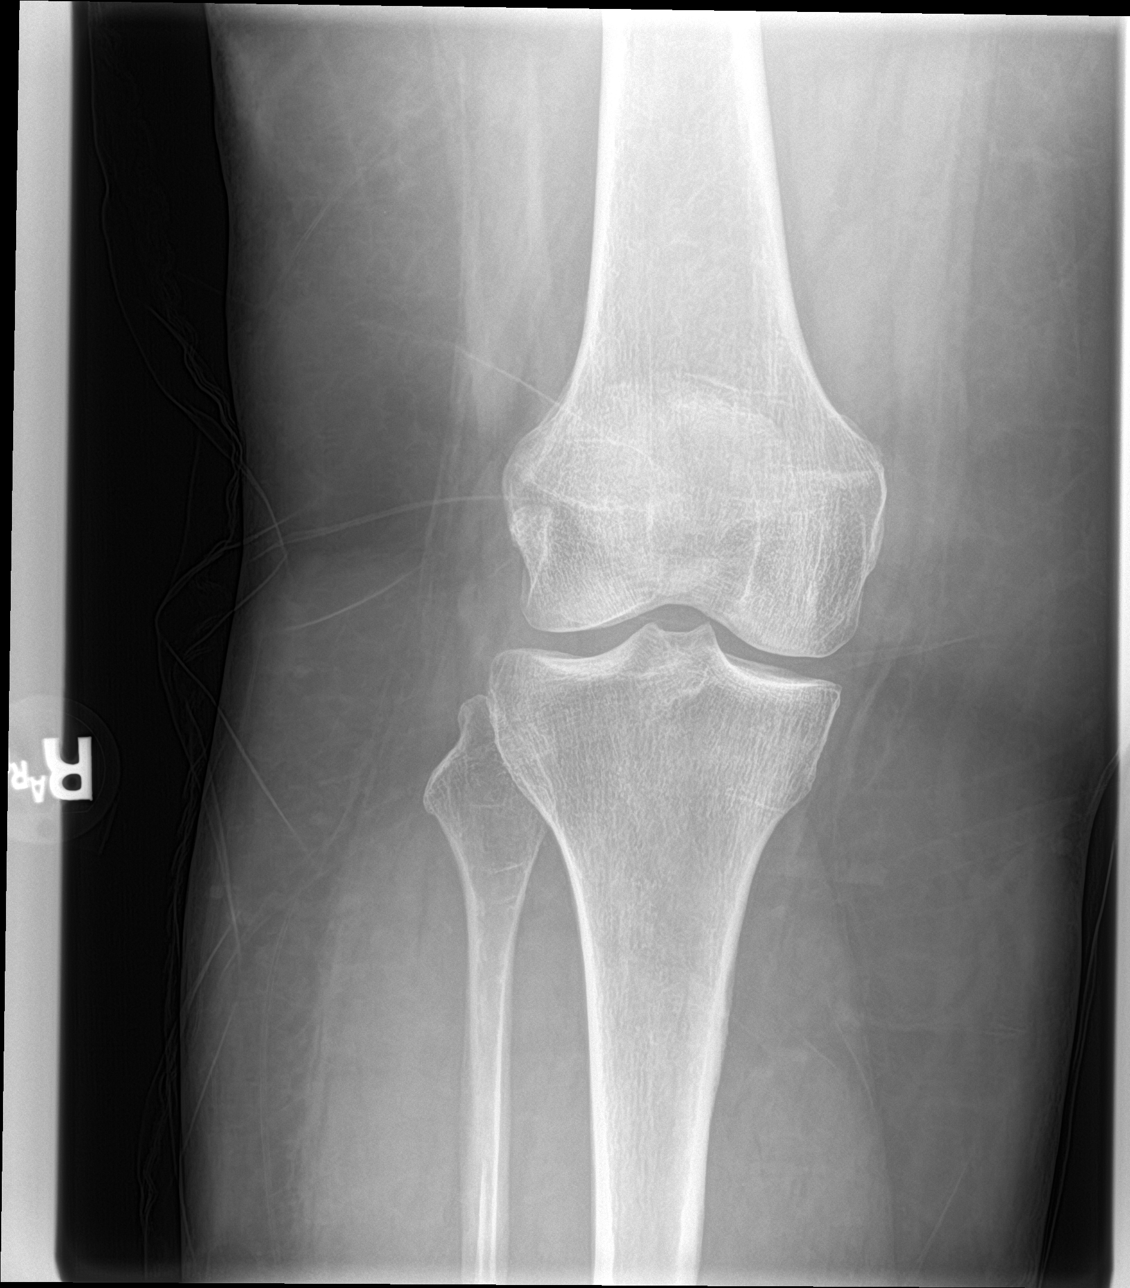

[knee lat]
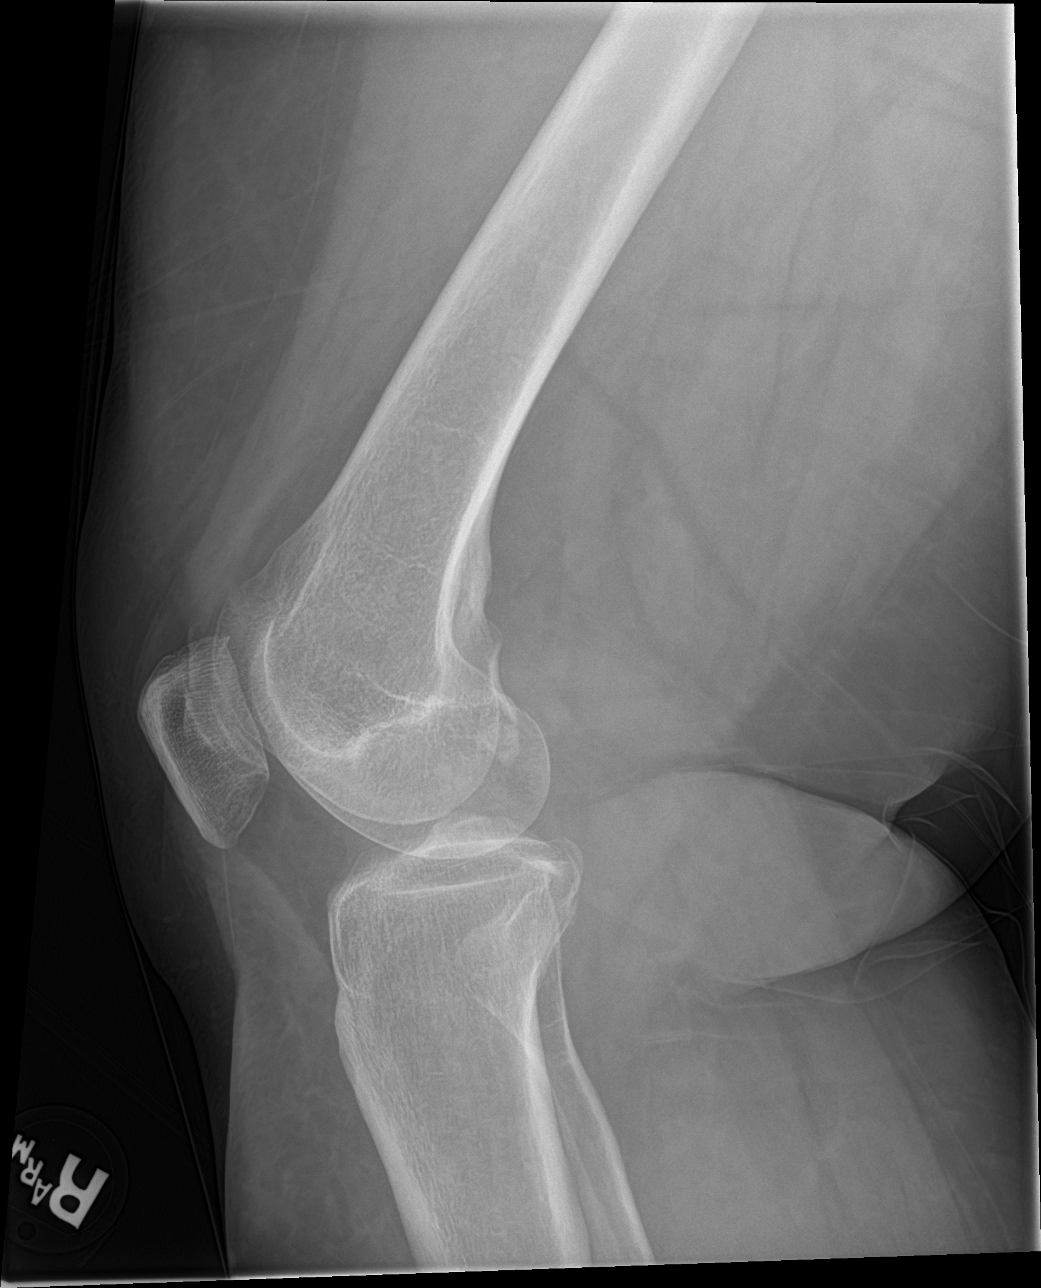

[knee obl (1 of 2)]
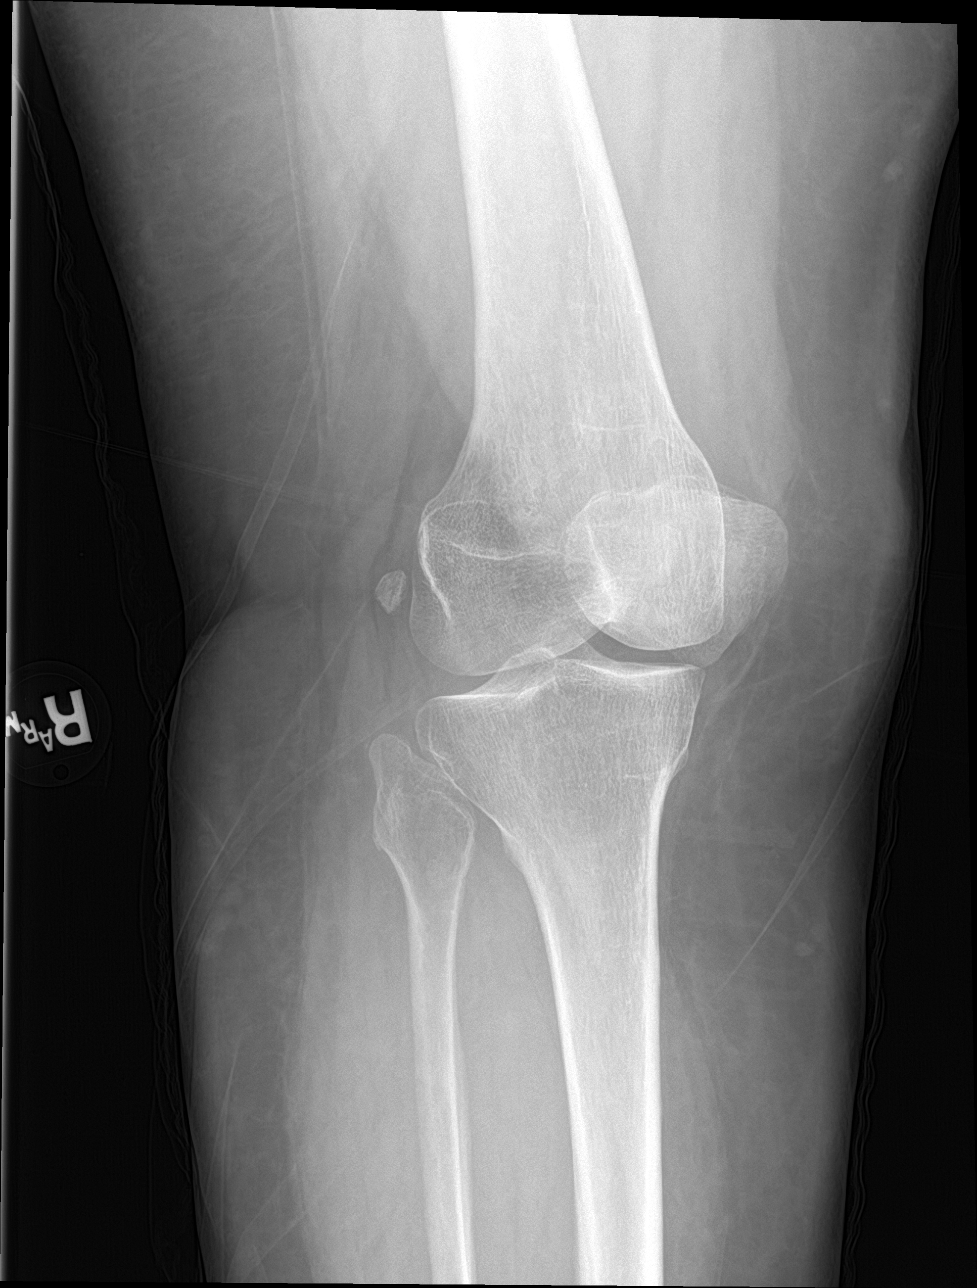

[knee obl (2 of 2)]
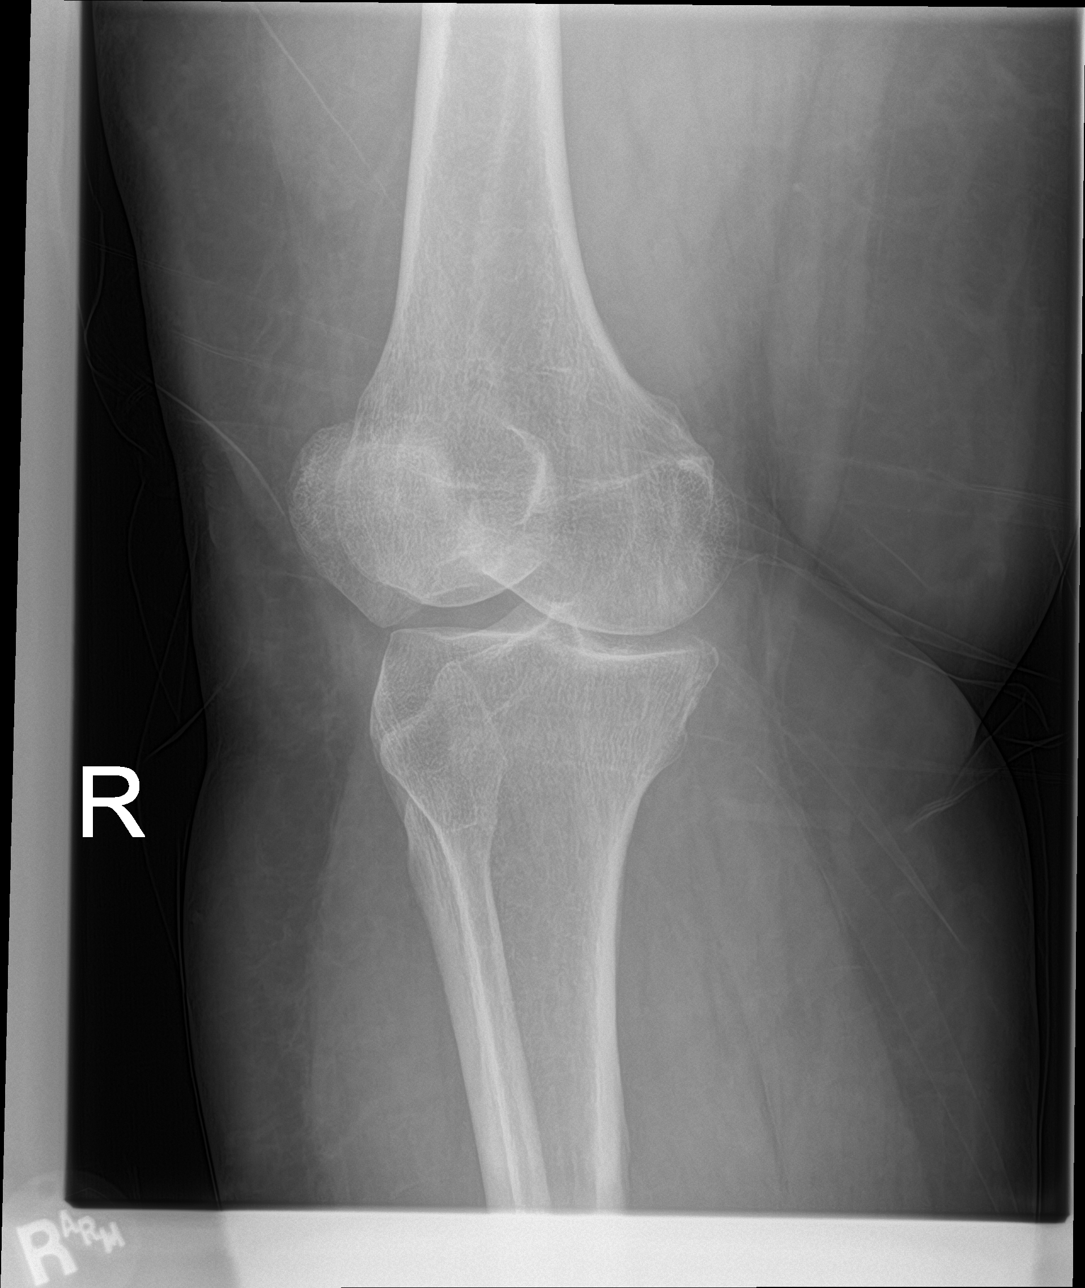

[4 of 4 positions shown; findings below may reference images not displayed]

FINDINGS: No evidence of fracture, dislocation, or joint effusion. No evidence
of arthropathy or other focal bone abnormality. Soft tissues are
unremarkable.
IMPRESSION: Negative right knee radiographs.

## 2018-08-25 IMAGING — DX DG TIBIA/FIBULA 2V*R*
2 series · 2 of 2 positions shown · non-contrast
Comparison: None.

CLINICAL DATA: Right knee pain after fall 1 month ago. Contusion
right lower leg. Pain in the right shin.

EXAM:
RIGHT TIBIA AND FIBULA - 2 VIEW

[tibia ap]
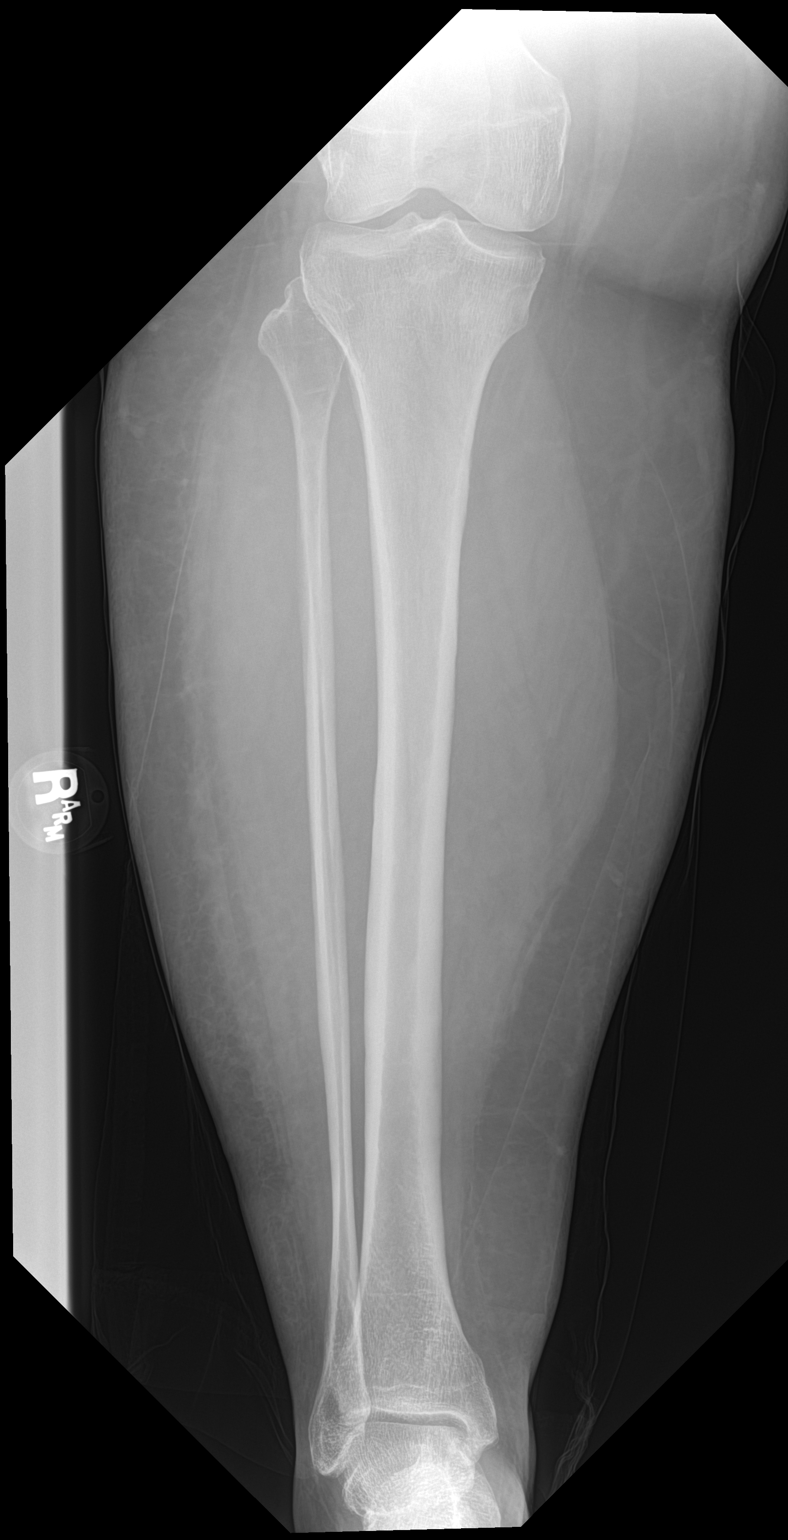

[tibia lat]
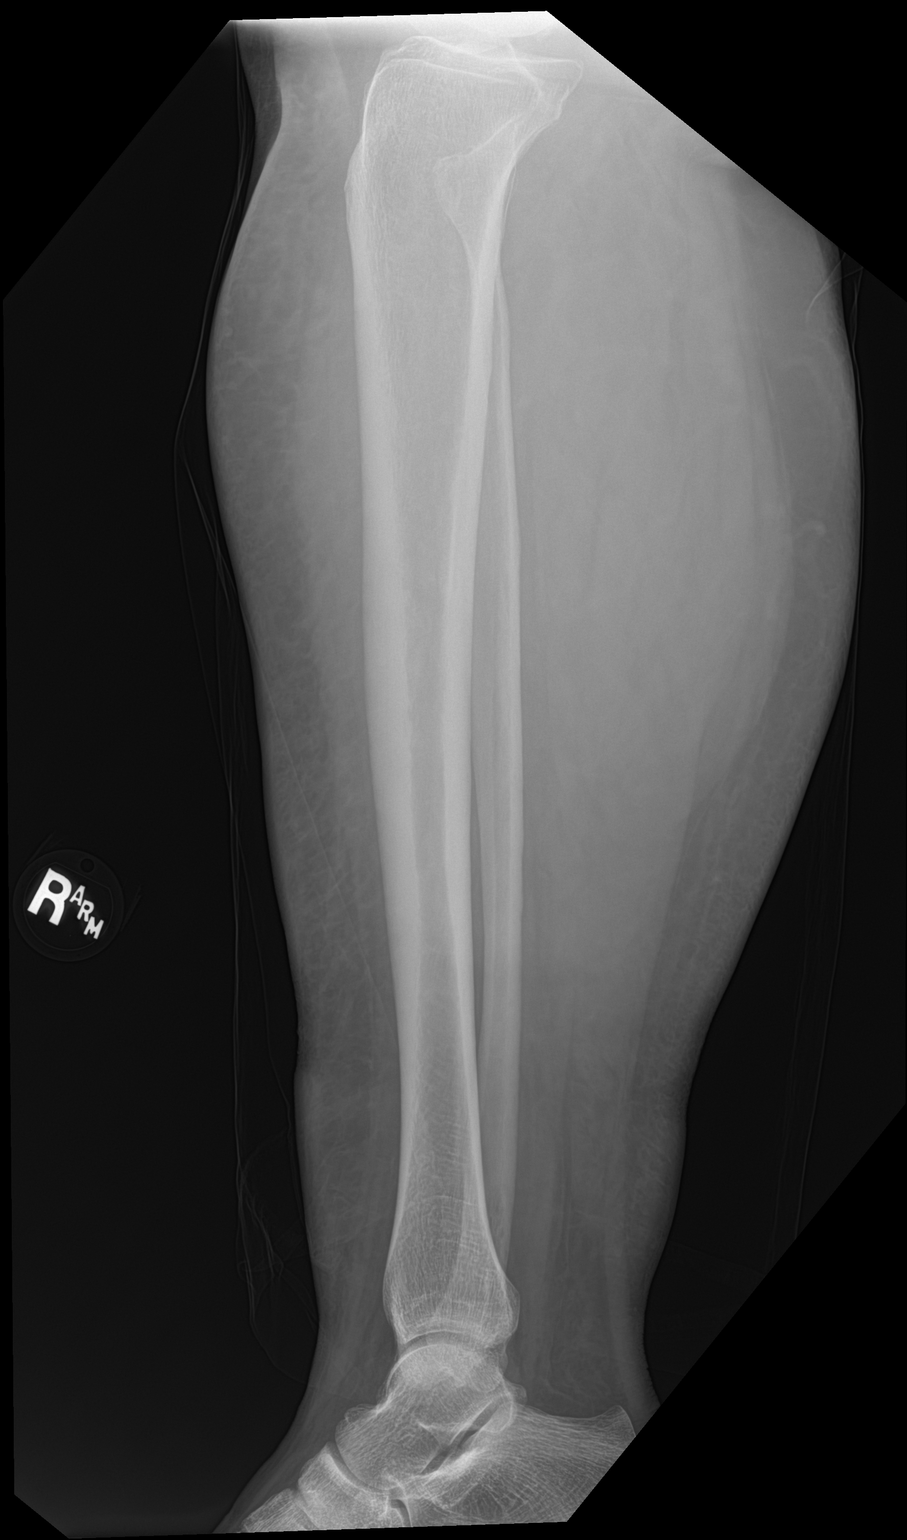

[2 of 2 positions shown; findings below may reference images not displayed]

FINDINGS: There is no evidence of fracture or other focal bone lesions. Soft
tissues are unremarkable.
IMPRESSION: Negative right tibia and fibula radiographs.

## 2019-10-07 ENCOUNTER — Telehealth: Payer: Self-pay | Admitting: Family Medicine

## 2019-10-07 NOTE — Telephone Encounter (Signed)
Pt had mammo CD in front folder. I mailed to home address
